# Patient Record
Sex: Female | Born: 2000 | Race: White | Hispanic: No | Marital: Single | Smoking: Never smoker
Health system: Southern US, Community
[De-identification: ages and names within clinical notes are randomized; demographics above are authoritative.]

---
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
## 2020-01-04 IMAGING — DX PORTABLE CHEST - 1 VIEW
1 series · 1 of 1 positions shown · non-contrast
Comparison: None.

CLINICAL DATA: Body aches with chills and cough.

EXAM:
PORTABLE CHEST 1 VIEW

[chest ap]
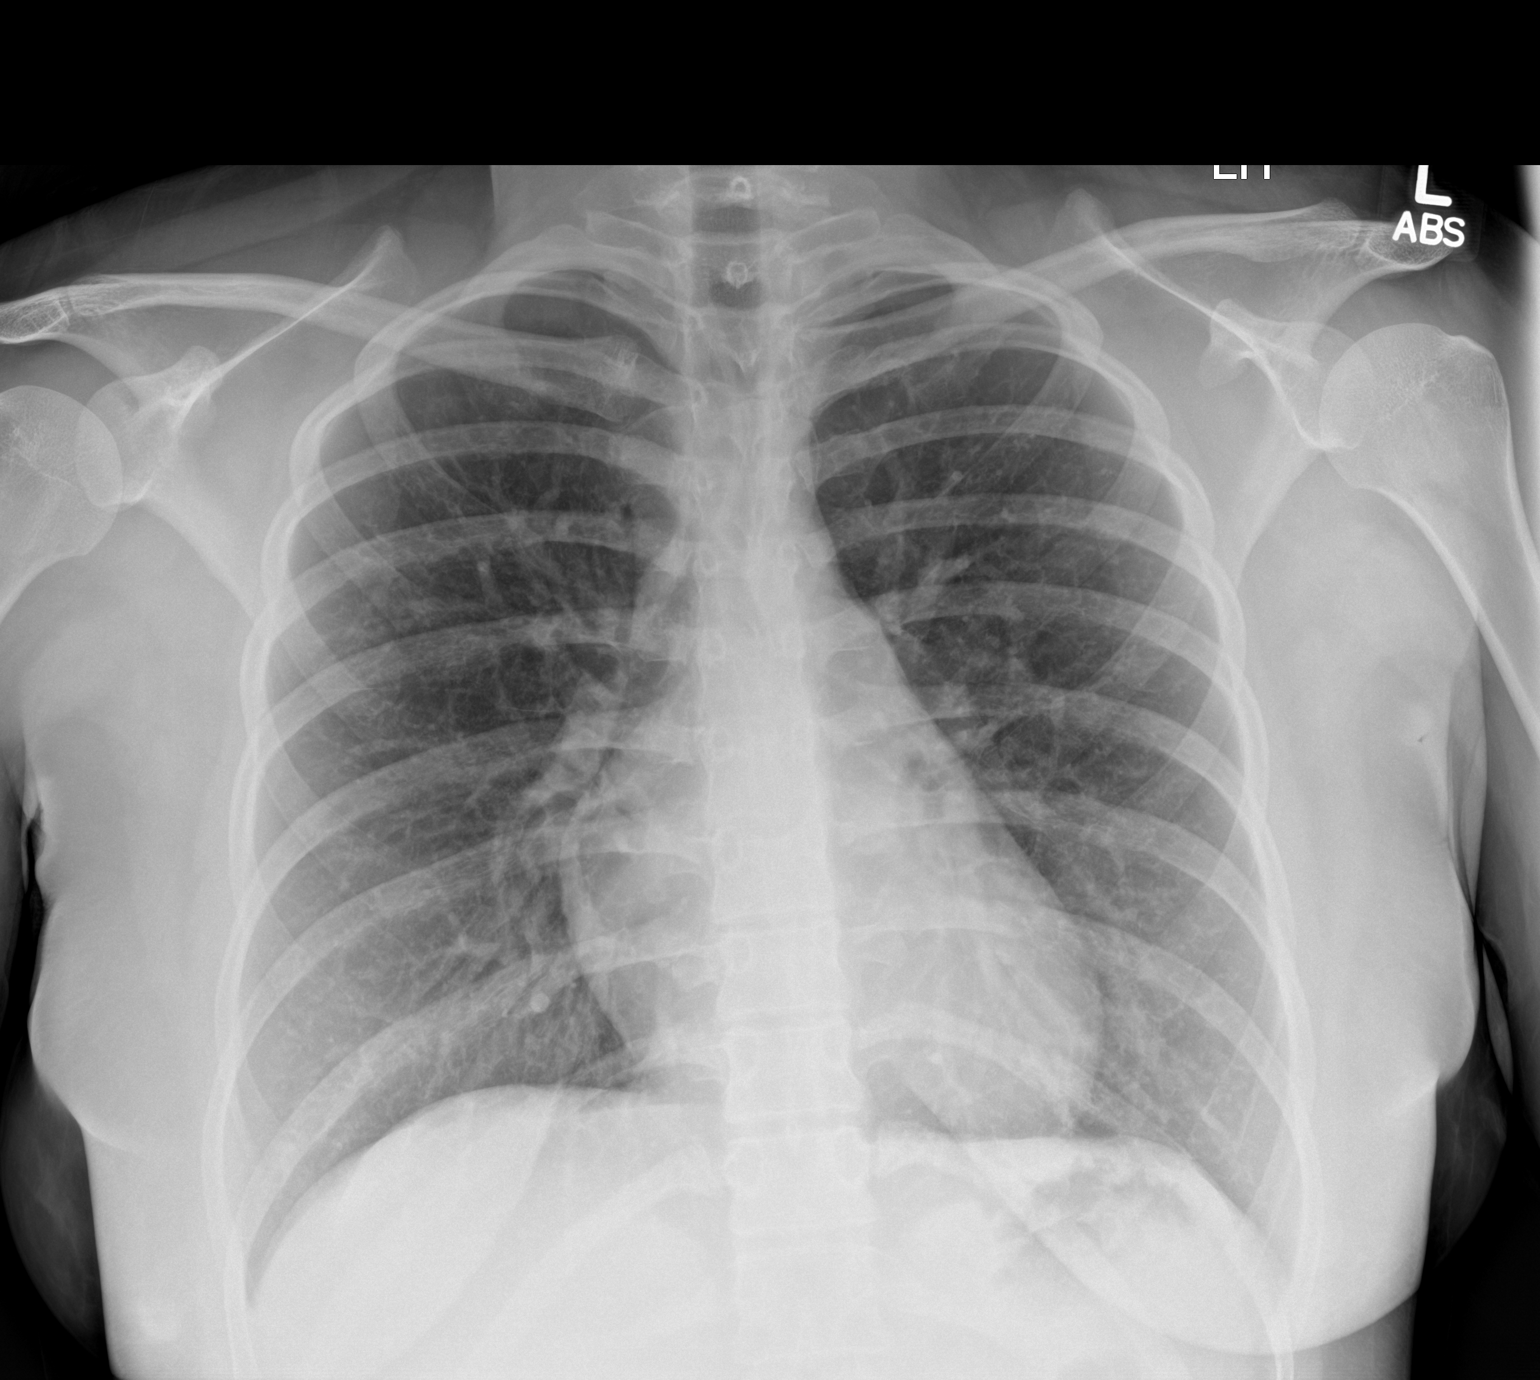

[1 of 1 positions shown; findings below may reference images not displayed]

FINDINGS: The heart size and mediastinal contours are within normal limits.
Both lungs are clear. The visualized skeletal structures are
unremarkable.
IMPRESSION: No active disease.
